# Patient Record
Sex: Male | Born: 1990 | Race: White | Hispanic: No | Marital: Single | Smoking: Never smoker
Health system: Southern US, Community
[De-identification: ages and names within clinical notes are randomized; demographics above are authoritative.]

---

## 2013-01-08 ENCOUNTER — Emergency Department: Payer: Self-pay | Admitting: Emergency Medicine

## 2013-06-10 ENCOUNTER — Ambulatory Visit: Payer: Self-pay | Admitting: Medical

## 2014-06-13 ENCOUNTER — Emergency Department: Payer: Self-pay | Admitting: Emergency Medicine

## 2014-06-22 ENCOUNTER — Ambulatory Visit: Payer: Self-pay | Admitting: Family Medicine

## 2014-06-24 ENCOUNTER — Encounter: Payer: Self-pay | Admitting: Sports Medicine

## 2014-06-24 ENCOUNTER — Ambulatory Visit (INDEPENDENT_AMBULATORY_CARE_PROVIDER_SITE_OTHER): Payer: BC Managed Care – PPO | Admitting: Sports Medicine

## 2014-06-24 VITALS — BP 117/73 | Ht 70.0 in | Wt 152.0 lb

## 2014-06-24 DIAGNOSIS — S62625A Displaced fracture of medial phalanx of left ring finger, initial encounter for closed fracture: Principal | ICD-10-CM

## 2014-06-24 DIAGNOSIS — IMO0002 Reserved for concepts with insufficient information to code with codable children: Secondary | ICD-10-CM | POA: Diagnosis not present

## 2014-06-24 DIAGNOSIS — IMO0001 Reserved for inherently not codable concepts without codable children: Secondary | ICD-10-CM | POA: Insufficient documentation

## 2014-06-24 NOTE — Progress Notes (Signed)
   Subjective:    Patient ID: Jose Bryant, male    DOB: 1991/01/22, 23 y.o.   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 He describes that his left hand was tangled in an opposing player's [place] when they fell to the ground, landing on the left hand. He noticed immediate pain located at the left ring finger with associated bruising and swelling. He went to [facility] that day where x-rays revealed a non-displaced 4th middle phalanx spiral fracture of the left hand. He has been wearing a finger splint since then. He notes that the finger remains somewhat painful and swollen. X-ray report was available for review, however, the images were not.  Past medical history, social history, medications, and allergies were reviewed and are up to date in the chart. Review of Systems 7 point review of systems was performed and was otherwise negative unless noted in the history of present illness.    Objective:   Physical Exam BP 117/73  Ht 5\' 10"  (1.778 m)  Wt 152 lb (68.947 kg)  BMI 21.81 kg/m2 GEN: The patient is well-developed well-nourished male and in no acute distress.  He is awake alert and oriented x3. SKIN: warm and well-perfused, no rash  Neuro: Strength 5/5 globally. Sensation intact throughout. No focal deficits. Vasc: +2 bilateral distal pulses. <2second capillary refill. MSK: Examination of the left hand reveals bruising and ecchymoses around the 4th finger. There appears to be less than 5 degrees of rotation of the distal finger. No step-off or bony deformity palpable. Though stiffened and painful, he has preserved strength at the PIP, DIP, and MCP with flexion and extension. No obvious tendinous injury. No joint laxity.    Assessment & Plan:  Please see problem based assessment and plan in the problem list.

## 2014-06-24 NOTE — Assessment & Plan Note (Signed)
-  Non-displaced spiral fx of 4th middle phalanx with less than 5 degrees of rotation of non-dominant hand -Keep finger in protective splint, can buddy tape to 3rd finger -Follow-up in 1-2 weeks for ultrasound of fracture to look for evidence of stable callus formation. -Repeat X-rays at next visit. -Monitor for any worsening signs of malrotation, increasing pain, swelling -If inadequate healing, would then consider Ortho referral

## 2014-06-25 ENCOUNTER — Ambulatory Visit: Payer: Self-pay | Admitting: Sports Medicine

## 2014-07-07 ENCOUNTER — Ambulatory Visit
Admission: RE | Admit: 2014-07-07 | Discharge: 2014-07-07 | Disposition: A | Discharge status: Home / Self Care | Payer: BC Managed Care – PPO | Source: Ambulatory Visit | Attending: Sports Medicine | Admitting: Sports Medicine

## 2014-07-07 ENCOUNTER — Encounter: Payer: Self-pay | Admitting: Sports Medicine

## 2014-07-07 ENCOUNTER — Ambulatory Visit (INDEPENDENT_AMBULATORY_CARE_PROVIDER_SITE_OTHER): Payer: BC Managed Care – PPO | Admitting: Sports Medicine

## 2014-07-07 VITALS — BP 106/72 | HR 79 | Ht 70.0 in | Wt 152.0 lb

## 2014-07-07 DIAGNOSIS — S62625D Displaced fracture of medial phalanx of left ring finger, subsequent encounter for fracture with routine healing: Secondary | ICD-10-CM | POA: Diagnosis not present

## 2014-07-07 DIAGNOSIS — IMO0001 Reserved for inherently not codable concepts without codable children: Secondary | ICD-10-CM

## 2014-07-07 NOTE — Assessment & Plan Note (Signed)
We'll obtain followup left fourth finger AP, lateral, and oblique x-rays today. -If everything appears to be in good position with signs of routine healing, he will followup in 2-3 weeks for reevaluation. -He will continue to wear his finger splint and remain out of soccer practice or competitions. -He may begin to do individual conditioning drills on his own. -We will call him with the x-ray results.

## 2014-07-07 NOTE — Progress Notes (Signed)
   Subjective:    Patient ID: Jose Bryant, male    DOB: Feb 12, 1991, 23 y.o.   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 Today he is approximately 2-1/2 weeks out. He has been wearing his finger splint diligently he says. He has not been practicing or competing in games. He denies any significant pain in the left hand or finger. He is not taking any medications at this time. He denies any numbness, tingling, loss of sensation, or increasing swelling.  Past medical history, social history, medications, and allergies were reviewed and are up to date in the chart.  Review of Systems 7 point review of systems was performed and was otherwise negative unless noted in the history of present illness.     Objective:   Physical Exam BP 106/72  Pulse 79  Ht 5\' 10"  (1.778 m)  Wt 152 lb (68.947 kg)  BMI 21.81 kg/m2 GEN: The patient is well-developed well-nourished male and in no acute distress.  He is awake alert and oriented x3. SKIN: warm and well-perfused, no rash  Neuro: Strength 5/5 globally. Sensation intact throughout. No focal deficits. Vasc: +2 bilateral distal pulses. No edema.  MSK: Examination of the left hand reveals full strength at the fourth finger joints. There is minimal swelling and bruising. There is no palpable step off at the fourth middle phalanx. There is no increase in rotation of the fourth finger on the left compared to the right.  Limited musculoskeletal ultrasound: Long and short axis views were obtained of the left 4th digit middle phalanx. The fracture line was identified and appears to be in good position. There is callus formation throughout. There are neovascular changes seen on Doppler imaging. There is no instability with dynamic testing.    Assessment & Plan:  Please see problem based assessment and plan in the problem list.

## 2014-07-08 ENCOUNTER — Telehealth: Payer: Self-pay | Admitting: Sports Medicine

## 2014-07-08 NOTE — Telephone Encounter (Signed)
Left VM. Discussed with Dr. Janee Mornhompson, [facility]. Patient should start taking finger out of the splint at least 3 times daily and doing some flexion ROM to avoid stiffness. Fx stable enough to start doing gentle exercises. Plan f/u as before. X-ray will show break for several months, but clinically will be healed.

## 2014-07-21 ENCOUNTER — Ambulatory Visit
Admission: RE | Admit: 2014-07-21 | Discharge: 2014-07-21 | Disposition: A | Discharge status: Home / Self Care | Payer: BC Managed Care – PPO | Source: Ambulatory Visit | Attending: Sports Medicine | Admitting: Sports Medicine

## 2014-07-21 ENCOUNTER — Encounter: Payer: Self-pay | Admitting: Sports Medicine

## 2014-07-21 ENCOUNTER — Ambulatory Visit (INDEPENDENT_AMBULATORY_CARE_PROVIDER_SITE_OTHER): Payer: BC Managed Care – PPO | Admitting: Sports Medicine

## 2014-07-21 VITALS — BP 110/60

## 2014-07-21 DIAGNOSIS — IMO0001 Reserved for inherently not codable concepts without codable children: Secondary | ICD-10-CM

## 2014-07-21 DIAGNOSIS — S62625D Displaced fracture of medial phalanx of left ring finger, subsequent encounter for fracture with routine healing: Principal | ICD-10-CM

## 2014-07-21 NOTE — Assessment & Plan Note (Signed)
-  Obtain updated x-rays of the 4th index finger now that he is 4 weeks out -Discontinue finger splint. -Start buddy taping, working on ROM exercises, gently push the DIP through ROM under warm water, but stop if significant pain -Remain out of soccer for at least the next 2 weeks (to complete out 6 wks post-fracture). -Plan follow-up in 4 weeks or sooner if needed with plan for repeat x-rays at that time (8wks post-injury)

## 2014-07-21 NOTE — Progress Notes (Signed)
   Subjective:    Patient ID: Jose Bryant, male    DOB: 1991-04-11, 23 y.o.   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 Today he is approximately 4-1/2 weeks out. He has been getting his finger out of his splint and doing range of motion exercises. He has not been practicing or competing in games. He denies any significant pain in the left hand or finger. He does note some stiffness at the DIP joint. He is not taking any medications at this time. He denies any numbness, tingling, loss of sensation, or increasing swelling.  Past medical history, social history, medications, and allergies were reviewed and are up to date in the chart.  Review of Systems  7 point review of systems was performed and was otherwise negative unless noted in the history of present illness.  Review of Systems     Objective:   Physical Exam BP 110/60 GEN: The patient is well-developed well-nourished male and in no acute distress. He is awake alert and oriented x3.  SKIN: warm and well-perfused, no rash  Neuro: Strength 5/5 globally. Sensation intact throughout. No focal deficits.  Vasc: +2 bilateral distal pulses. No edema.  MSK: Examination of the left hand reveals full strength at the fourth finger joints. There is minimal swelling and bruising. There is no palpable step off at the fourth middle phalanx. There is no increase in rotation of the fourth finger on the left compared to the right.  X-rays of the left fourth finger were obtained following his last visit showing normal bony alignment. The fracture line was visible.    Assessment & Plan:  Please see problem based assessment and plan in the problem list.

## 2014-08-25 ENCOUNTER — Encounter: Payer: Self-pay | Admitting: Sports Medicine

## 2014-08-25 ENCOUNTER — Ambulatory Visit
Admission: RE | Admit: 2014-08-25 | Discharge: 2014-08-25 | Disposition: A | Discharge status: Home / Self Care | Payer: BC Managed Care – PPO | Source: Ambulatory Visit | Attending: Sports Medicine | Admitting: Sports Medicine

## 2014-08-25 ENCOUNTER — Ambulatory Visit (INDEPENDENT_AMBULATORY_CARE_PROVIDER_SITE_OTHER): Payer: BC Managed Care – PPO | Admitting: Sports Medicine

## 2014-08-25 VITALS — BP 112/72 | Ht 70.0 in | Wt 150.0 lb

## 2014-08-25 DIAGNOSIS — S62625D Displaced fracture of medial phalanx of left ring finger, subsequent encounter for fracture with routine healing: Principal | ICD-10-CM

## 2014-08-25 DIAGNOSIS — IMO0001 Reserved for inherently not codable concepts without codable children: Secondary | ICD-10-CM

## 2014-08-25 NOTE — Progress Notes (Signed)
   Subjective:    Patient ID: Jose Bryant, male    DOB: 10-12-1990, 23 y.o.   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 Today he is approximately 9-1/2 weeks out. He has been buddy taping his finger if he is active and doing range of motion exercises. He notes less stiffness in the finger. He denies any significant pain in the left hand or finger.  He is not taking any medications at this time. He denies any numbness, tingling, loss of sensation, or increasing swelling.  7 point review of systems was performed and was otherwise negative unless noted in the history of present illness. Review of Systems     Objective:   Physical Exam BP 112/72 mmHg  Ht 5\' 10"  (1.778 m)  Wt 150 lb (68.04 kg)  BMI 21.52 kg/m2 GEN: The patient is well-developed well-nourished male and in no acute distress. He is awake alert and oriented x3.  SKIN: warm and well-perfused, no rash  Neuro: Strength 5/5 globally. Sensation intact throughout. No focal deficits.  Vasc: +2 bilateral distal pulses. No edema.  MSK: Examination of the left hand reveals full strength at the fourth finger joints. There is no swelling or bruising. There is no palpable step off at the fourth middle phalanx. There is less than 5 degrees of distal rotation of the fourth finger on the left compared to the right.  X-rays of the left fourth finger were obtained today showing normal bony alignment. The fracture line appears to be filling in significantly compared to prior films.    Assessment & Plan:  1. Closed Left 4th Middle Phalanx Fracture, healing well, 9.5 wks out  -Continue to protect if active by buddy taping over the next few weeks -We discussed that the slightest rotation of the distal phalanx is minor and should not affect his function, which he agrees with. I also explained that the only way to prevent this  would be to undergo a pinning procedure when he initially broke it, as we have previously discussed. He is very pleased with the results. -Resume return to full activities in graduated fashion -At this point he will follow-up as needed.

## 2016-07-15 IMAGING — CR DG FINGER RING 2+V*L*
4 series · 4 of 4 positions shown · non-contrast
Comparison: 07/07/2014

CLINICAL DATA: Subsequent encounter for ring finger fracture.

EXAM:
LEFT RING FINGER 2+V

[view not recorded (1 of 4)]
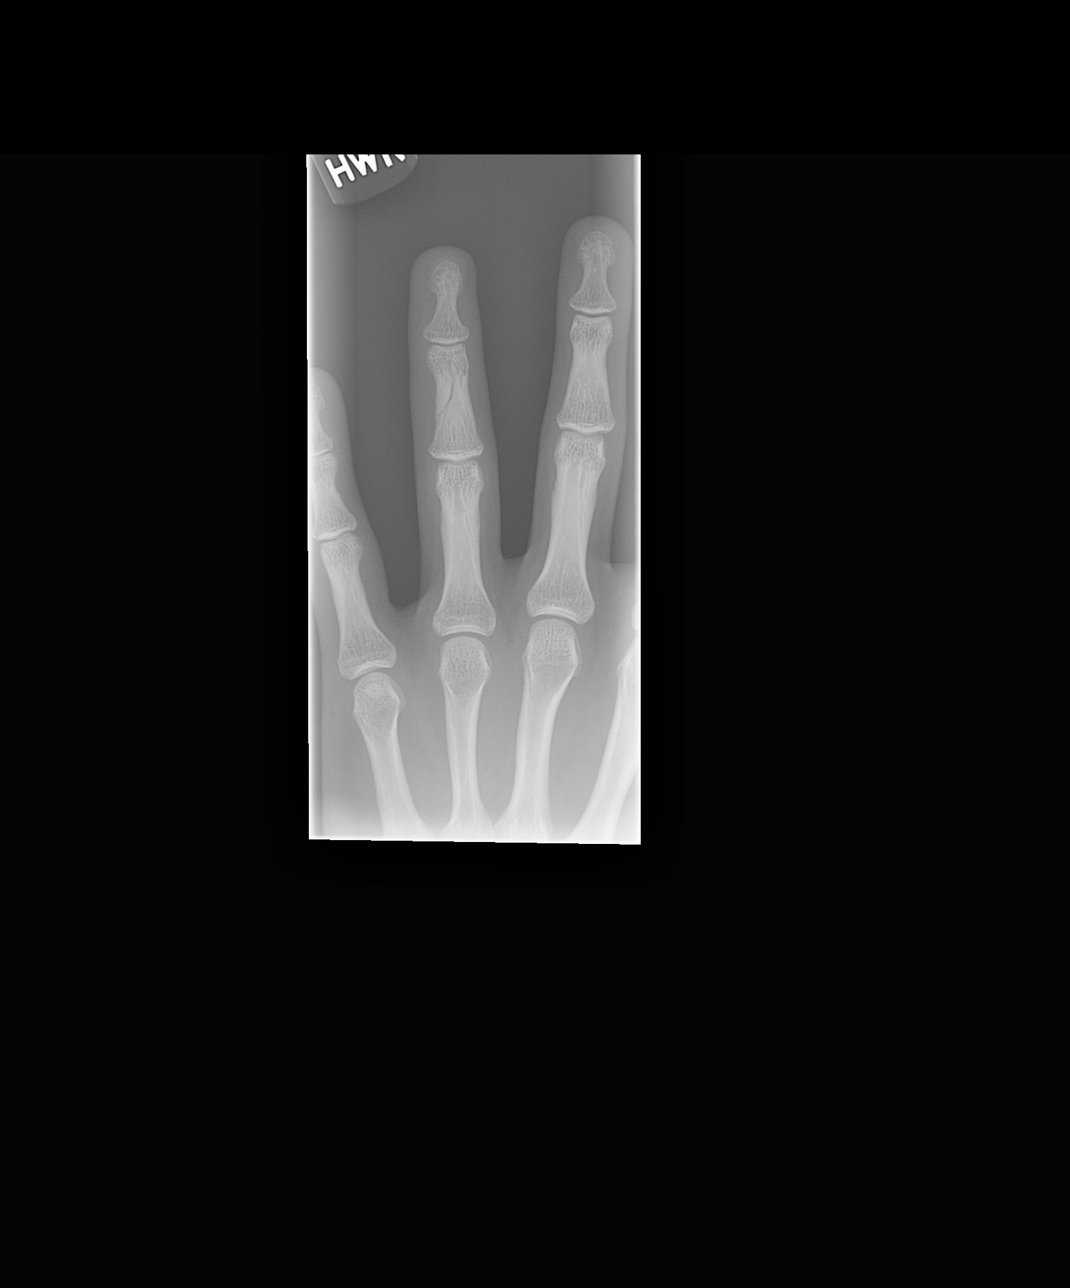

[view not recorded (2 of 4)]
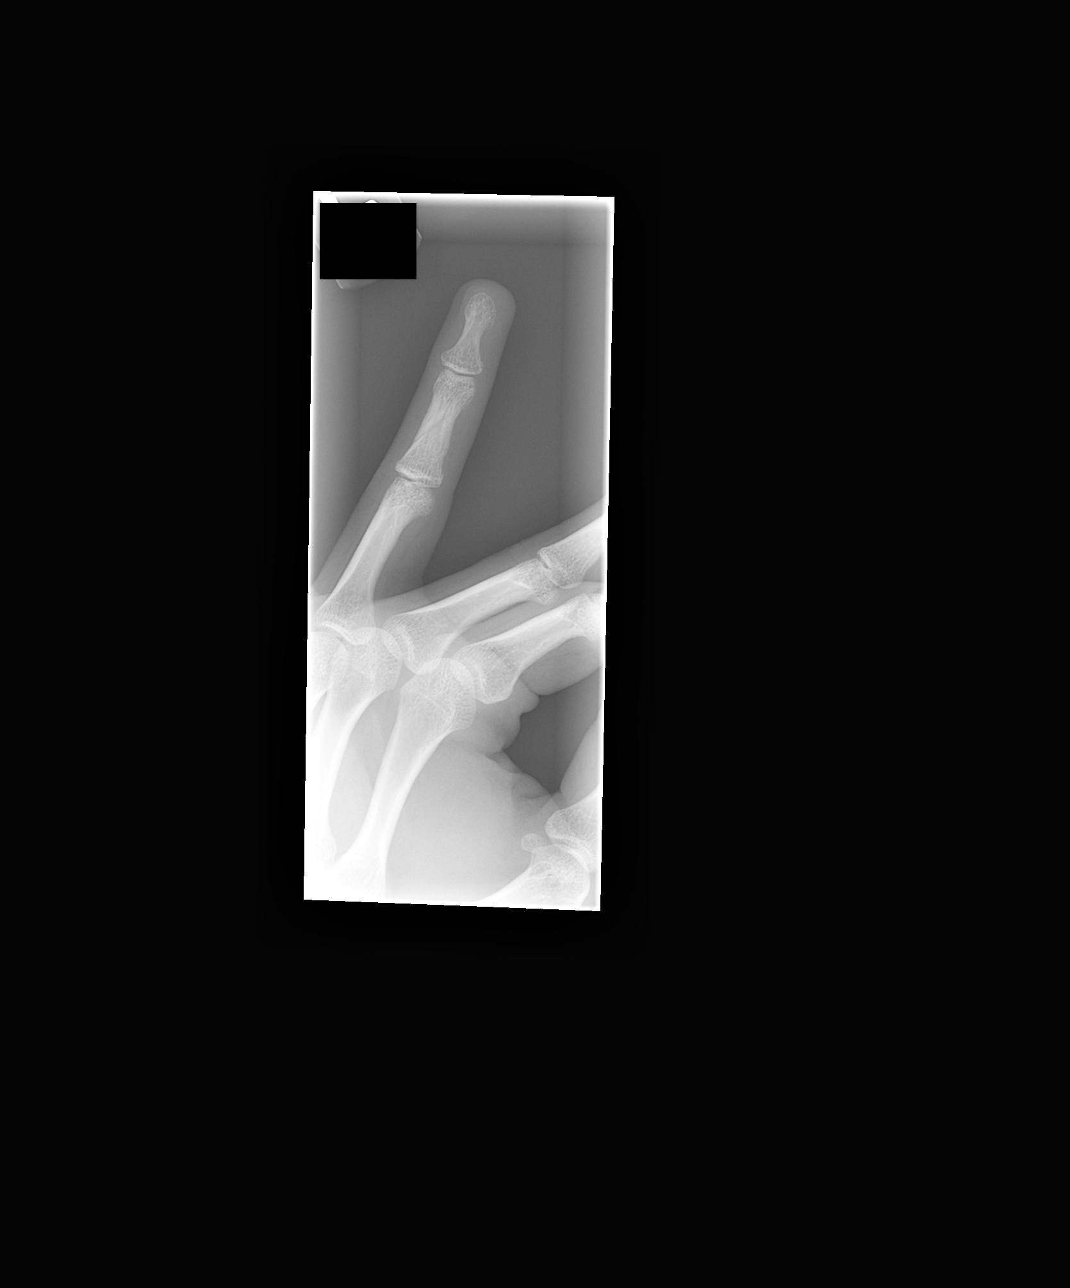

[view not recorded (3 of 4)]
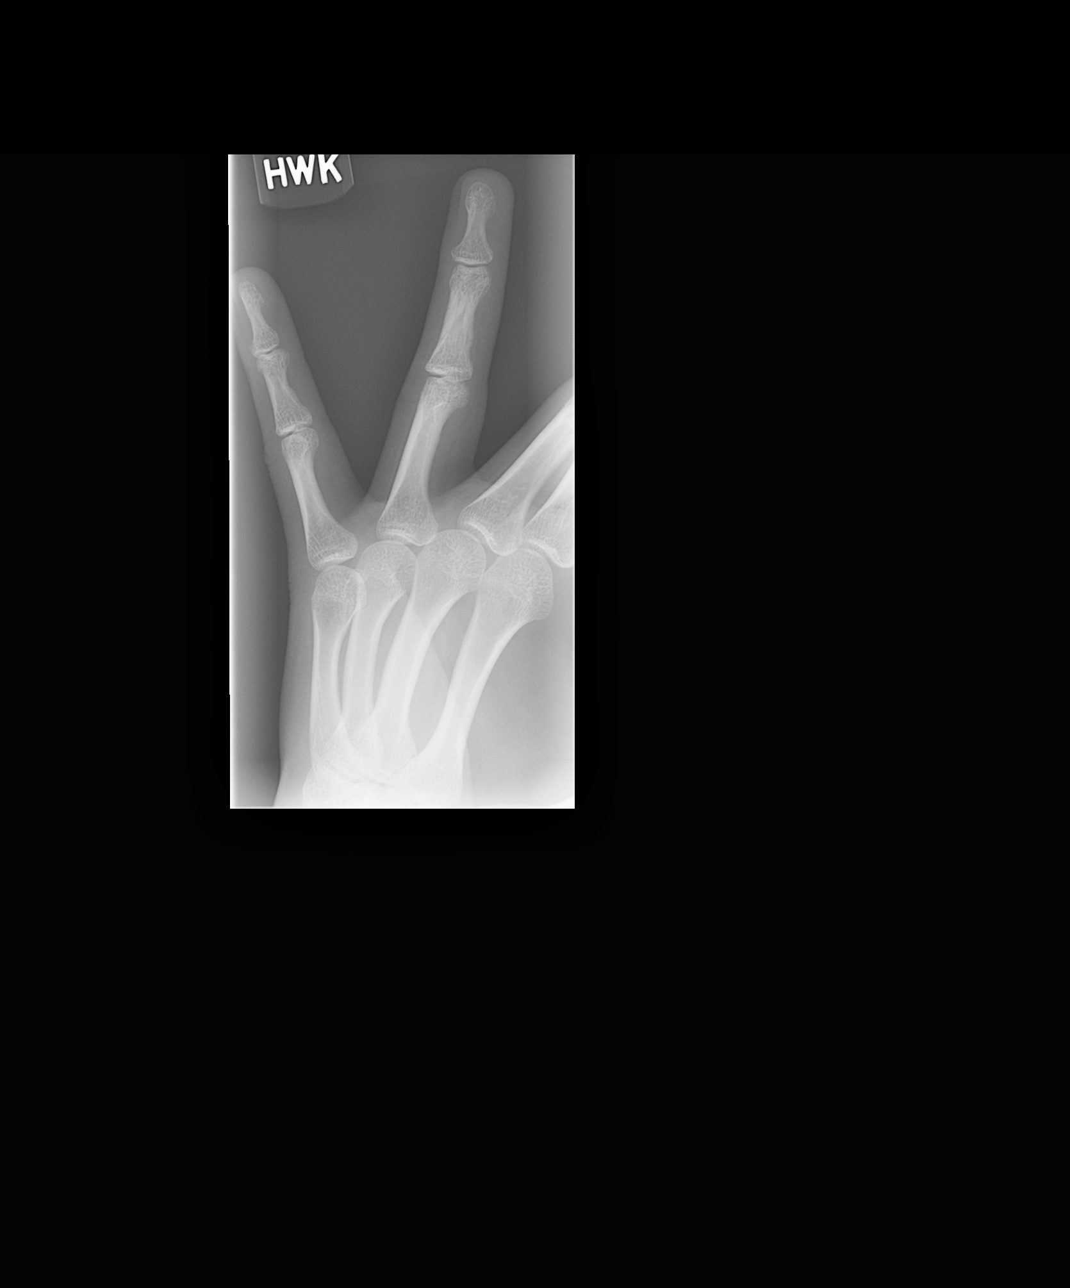

[view not recorded (4 of 4)]
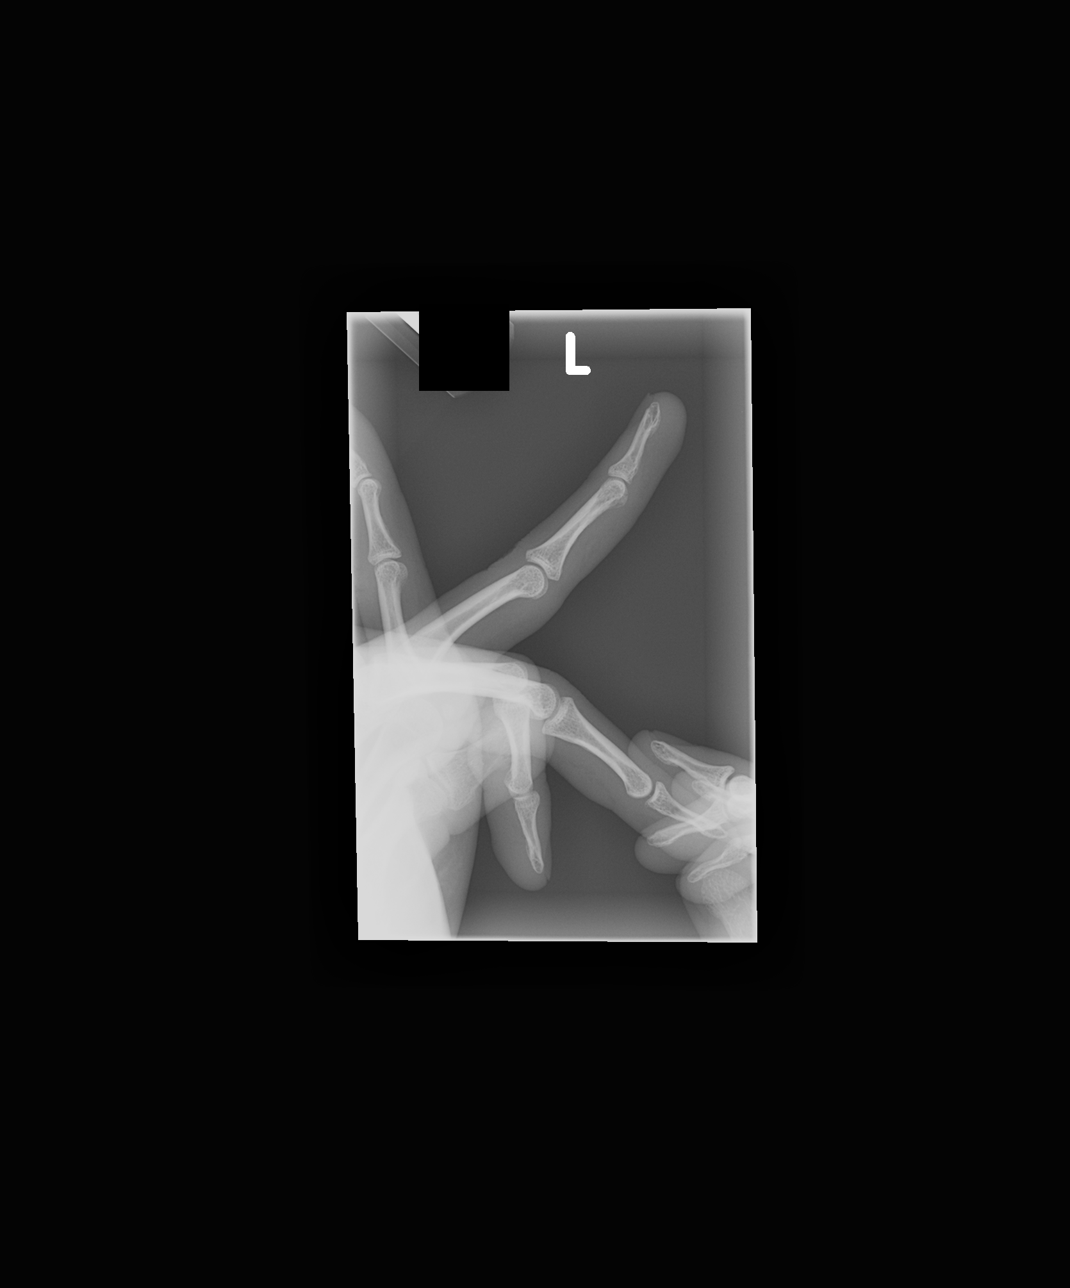

[4 of 4 positions shown; findings below may reference images not displayed]

FINDINGS: Stable nondisplaced fracture involving the middle phalanx of the
ring finger. No obvious callus formation.
IMPRESSION: Stable fracture of the middle phalanx.

## 2019-11-19 ENCOUNTER — Inpatient Hospital Stay: Admit: 2019-11-19 | Discharge: 2019-11-19 | Payer: PRIVATE HEALTH INSURANCE

## 2019-11-19 ENCOUNTER — Encounter: Admit: 2019-11-19 | Payer: PRIVATE HEALTH INSURANCE | Primary: Internal Medicine

## 2019-11-19 ENCOUNTER — Emergency Department: Admit: 2019-11-19 | Payer: PRIVATE HEALTH INSURANCE | Primary: Internal Medicine

## 2019-11-19 DIAGNOSIS — N50812 Left testicular pain: Secondary | ICD-10-CM

## 2019-11-19 MED ORDER — LEVOFLOXACIN 500 MG TABLET
500 mg | ORAL_TABLET | Freq: Every day | ORAL | 1 refills | Status: AC
Start: 2019-11-19 — End: ?

## 2019-11-20 NOTE — ED Notes
2255 Pt to Rm #11 ambulatory, changed into gown, awaiting exam by Clinician & U/S. Pt reports onset of left sided testicular pain, noted approx 5p after work today. Pt denies injury/trauma to area, denies urinary sx or penile discharge2358 Pt to U/S dept via stretcher2325 Pt ret'd from U/S dept via stretcher2340 Exam by Dr Sherron Flemings, awaiting further [id number] Pt cleared for & prepared for dischargeElectronically Signed by Raylene Everts, RN, November 19, 2019

## 2019-11-20 NOTE — ED Notes
Pt taken to [place] scan.

## 2019-11-20 NOTE — ED Provider Notes
HistoryChief Complaint Patient presents with ? Testicle Pain   Came in with left testicular pain since 5pm today, denies trauma.  Testicle PainThis is a new problem. Episode onset: Today after work  The problem occurs constantly. The problem has not changed since onset.Exacerbated by: Certain positions. He has tried nothing for the symptoms.  No past medical history on file.No past surgical history on file.Family History Problem Relation Age of Onset ? Hypertension Father  ? Cancer Maternal Grandfather       Esophageal Cancer  ? Heart disease Maternal Grandfather  Social History Socioeconomic History ? Marital status: Single   Spouse name: Not on file ? Number of children: Not on file ? Years of education: Not on file ? Highest education level: Not on file Tobacco Use ? Smoking status: Never Smoker ? Smokeless tobacco: Never Used Substance and Sexual Activity ? Alcohol use: Yes   Comment: socially  ? Drug use: Never ? Sexual activity: Yes   Partners: Female   Comment: Girlfriend  ED Other Social History E-cigarette/Vaping Substances E-cigarette/Vaping Devices Review of Systems Genitourinary: Positive for testicular pain. All other systems reviewed and are negative. Physical ExamED Triage Vitals [11/19/19 2252]BP: (!) 157/83Pulse: (!) 96Pulse from  O2 sat: n/aResp: 17Temp: 97.6 ?F (36.4 ?C)Temp src: TemporalSpO2: 97 % BP 130/77  - Pulse 73  - Temp 97.6 ?F (36.4 ?C) (Temporal)  - Resp 16  - Ht 5' 10 (1.778 m)  - Wt 68 kg  - SpO2 98%  - BMI 21.52 kg/m? Physical ExamHENT:    Head: Atraumatic.    Nose: Nose normal. Eyes:    Conjunctiva/sclera: Conjunctivae normal. Pulmonary:    Effort: No respiratory distress. Genitourinary:   Comments: Mildly tender L epididymis, testicles in normal position, no erythema or edema. Musculoskeletal:       General: No deformity. Skin:   Coloration: Skin is not jaundiced. Neurological:    Mental Status: He is alert. Mental status is at baseline. Psychiatric:       Mood and Affect: Mood normal.  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 He is monogamous with one partner and uses condoms. He has zero concern for STDs. His ultrasound was negative for testicular torsion. His epididymitis on exam was mildly tender but not the area where he was complaining of most pain. I prescribed him levofloxacin and told him that I doubt he has epididymitis but if his symptoms are worsening or he develops a fever he should start taking the medication or if symptoms stay at same level and not improving in 48 hours. Went over risks of tendon injury with levofloxacin, he does not engage in activities that would put him at risk for this. Cameron Proud MD MPHClinical Impressions as of Feb 25 0001 Pain in left testicle  ED DispositionDischarge Cameron Proud, MD02/25/21 0001

## 2019-11-20 NOTE — Discharge Instructions
If symptoms worsen or no improvement in 48 hours take the prescription

## 2020-01-01 ENCOUNTER — Telehealth: Admit: 2020-01-01 | Payer: PRIVATE HEALTH INSURANCE | Primary: Internal Medicine

## 2020-01-01 NOTE — Telephone Encounter
1st attempt called lvm to call back to schd an appointment for Pain in testicle schd in [place] with Dr Crisoforo Oxford referral

## 2020-01-26 MED ORDER — CIPROFLOXACIN 0.3 % EYE DROPS
0.3 | Freq: Four times a day (QID) | OPHTHALMIC | 1 refills | 10.00000 days | Status: AC
Start: 2020-01-26 — End: ?

## 2020-02-07 ENCOUNTER — Encounter: Admit: 2020-02-07 | Payer: PRIVATE HEALTH INSURANCE | Attending: Medical | Primary: Internal Medicine

## 2020-02-07 ENCOUNTER — Ambulatory Visit: Admit: 2020-02-07 | Payer: BLUE CROSS/BLUE SHIELD | Attending: Medical | Primary: Internal Medicine

## 2020-02-07 DIAGNOSIS — N50819 Testicular pain, unspecified: Secondary | ICD-10-CM

## 2020-02-07 NOTE — Progress Notes
HPIAdem Haynes is a 29 y.o. male referred to Urology by Dr. 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  Patient is a surgical physician assistant.  He works in [employer].  On 11/19/2019 he developed severe left testicular pain after standing all day and surgery. [facility].  The showed no focal masses or evidence of torsion or epididymitis.  The patient states that the pain improved without any specific treatment.  However he has continued to have intermittent low-grade left testicular pain that is centered on a focal area in the epididymis.  At times this area seems to be enlarged and firm and at other times totally normal.  He is mainly concerned about the possibility of a malignancy.  The pain itself is otherwise tolerable and he has not required any specific treatment for it.  He denies any flank pain or history of kidney stone.  He denies any hematuria or dysuria.  He reports a strong urinary stream and has no nocturia.  He is sexually active with 1 male partner for the last 8 years and uses condoms.  He is otherwise healthy and denies any nausea or vomiting, abdominal pain or bowel difficulties.  He had a recent corneal abrasion that has healed.  He has no known allergies to medications.  He takes no regular medications at this time.Study: US SCROTUM AND TESTICLES WITH LIMITED DOPPLER 11/19/2019?History: Left-sided scrotal pain since this evening.?Comparison: None available.?Technique: Wallace Cullens scale, color and pulsed Doppler imaging were utilized.?Findings:?The right testis measures 4.0 x 2.6 x 2.1 cm with normal echotexture, without focal masses. On pulsed Doppler, the intratesticular Ryan is 0.6. The right epididymal head measures 0.8 x 0.8 x 1.1 cm with an [place] of 0.5. ?The left testis measures 4.0 x 3.2 x 2.1 cm with normal echotexture, without focal masses. On pulsed Doppler, the intratesticular [place] is 0.6. The left epididymal head measures 0.8 x 0.9 x 0.9 cm with an [place] of 0.5. There is a 0.2 cm left epididymal head cyst.?Normal and symmetric flow is demonstrated in both testes on color Doppler. The epididymides demonstrate normal and symmetric flow bilaterally on color Doppler.?There is no varicocele or hydrocele.?Spermatic cords are normal.    Impression: ?Normal scrotal ultrasound; no evidence of torsion or epididymitis/orchitis.?Report Initiated By:  Mardene Speak, MD?Reported And Signed By: Lavera Guise, MDPast Medical HistoryNo past medical history on file.Past Surgical HistoryNo past surgical history on file.AllergiesNo Known AllergiesMedicationsNo outpatient encounter medications on file as of 02/07/2020. No facility-administered encounter medications on file as of 02/07/2020.   Social HistorySocial History Tobacco Use ? Smoking status: Never Smoker ? Smokeless tobacco: Never Used Substance Use Topics ? Alcohol use: Yes   Comment: socially  ? Drug use: Never  Family History Family History Problem Relation Age of Onset ? Hypertension Father  ? Cancer Maternal Grandfather       Esophageal Cancer  ? Heart disease Maternal Grandfather  Review of SystemsNo symptoms beyond those noted in HPI and PMH- all other systems are negativePhysical ExamBP 127/70  - Pulse 72  - Temp 98.2 ?F (36.8 ?C) (No-touch scanner)  - Ht 5' 10 (1.778 m)  - Wt 70.3 kg  - SpO2 97%  - BMI 22.24 kg/m? Constitutional: Oriented to person, place, and time. Appears well-developed and well-nourished. HENT: Trachea appears midlineHead: Normocephalic and atraumatic. Eyes: Conjunctivae are normal Neck: No tracheal deviation present. Pulmonary/Chest: Effort normal. Flank:  No CVA tenderness.Abdomen:  Flat, soft and nontender.  No guarding  or rebound.  Negative Murphy sign and no McBurney's point tenderness.Genital:  No inguinal adenopathy.  No genital rashes or sores.  Normal phallus.  Orthotopic meatus. Glans without lesions.  No urethral discharge.  Normal appearing scrotum.  Testes of normal size, consistency and lie and without masses, swelling or tenderness.  No palpable inguinal hernias.  Right cord structures are normal.  Left cord structures show a very mild left varicocele with Valsalva.  No palpable thrombosed varicocele.Musculoskeletal: Normal range of motion. Neurological: alert and oriented to person, place, and time. No focal deficits are evident. Skin: Skin is dry and without obvious lesionPsychiatric: Has a normal mood and affect. Behavior is normal. Judgment and thought content normal.Lab Results Component Value Date  UCOLOR yellow 02/07/2020  UGLUCOSE Negative 02/07/2020  UKETONE Negative 02/07/2020  USPECGRAVITY 1.010 02/07/2020  UPROTEIN Negative 02/07/2020  UNITRATES Negative 02/07/2020  UBLOOD Negative 02/07/2020  ULEUKOCYTES Negative 02/07/2020   No results found for: PVRPOCTAssessment & Plan:Manuel Haynes is a 29 y.o. male with intermittent left testicular pain for the past 3 months.  He has no history of kidney stone, flank pain or hematuria.  A scrotal ultrasound was negative for testicular mass or torsion.  On examination today no testicular mass is detected.  Testes are of normal size and are nontender.  No inguinal hernias were appreciated.  He has a small left varicocele.  Urinalysis today is negative.  I discussed the situation with the patient.  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  Management options for a varicocele reviewed.  The patient states that his main concern was ruling out possible malignancy and that the pain that he experiences is is not very bothersome at this point.  I reviewed conservative measures to help with pain including scrotal elevation, ice, Tylenol and anti-inflammatory medications.  The patient is already is already doing regular testicular self examination. He is encouraged to contact us should he have any questions, concerns or new symptoms going forward.  Strict for ER parameters were also reviewed.Encounter time: 30 minutes were spent reviewing the chart, taking a history, examining the patient, counseling the patient and making medical decisions, and coordinating follow up care.Caro Hight, PA

## 2020-07-18 ENCOUNTER — Inpatient Hospital Stay: Admit: 2020-07-18 | Discharge: 2020-07-18 | Payer: BLUE CROSS/BLUE SHIELD | Primary: Internal Medicine

## 2020-07-18 DIAGNOSIS — Z20828 Contact with and (suspected) exposure to other viral communicable diseases: Secondary | ICD-10-CM

## 2020-07-19 DIAGNOSIS — Z20822 Contact with and (suspected) exposure to covid-19: Secondary | ICD-10-CM

## 2020-07-19 LAB — SARS COV-2 (COVID-19) RNA-~~LOC~~ LABS (BH GH LMW YH): BKR SARS-COV-2 RNA (COVID-19) (YH): NOT DETECTED

## 2020-08-25 ENCOUNTER — Inpatient Hospital Stay: Admit: 2020-08-25 | Discharge: 2020-08-25 | Payer: BLUE CROSS/BLUE SHIELD | Primary: Internal Medicine

## 2020-08-25 DIAGNOSIS — Z20828 Contact with and (suspected) exposure to other viral communicable diseases: Secondary | ICD-10-CM

## 2020-08-26 DIAGNOSIS — Z20822 Contact with and (suspected) exposure to covid-19: Secondary | ICD-10-CM

## 2020-08-26 LAB — COVID-19 CLEARANCE OR FOR PLACEMENT ONLY: BKR SARS-COV-2 RNA (COVID-19) (YH): NOT DETECTED

## 2021-02-03 ENCOUNTER — Ambulatory Visit: Admit: 2021-02-03 | Payer: BLUE CROSS/BLUE SHIELD | Primary: Internal Medicine

## 2021-02-05 ENCOUNTER — Inpatient Hospital Stay: Admit: 2021-02-05 | Discharge: 2021-02-05 | Payer: BLUE CROSS/BLUE SHIELD | Primary: Internal Medicine

## 2021-02-05 DIAGNOSIS — Z20822 Contact with and (suspected) exposure to covid-19: Secondary | ICD-10-CM

## 2021-02-05 DIAGNOSIS — Z20828 Contact with and (suspected) exposure to other viral communicable diseases: Secondary | ICD-10-CM

## 2021-02-06 LAB — COVID-19 CLEARANCE OR FOR PLACEMENT ONLY: BKR SARS-COV-2 RNA (COVID-19) (YH): NOT DETECTED

## 2023-06-13 ENCOUNTER — Encounter
Admit: 2023-06-13 | Payer: PRIVATE HEALTH INSURANCE | Attending: Student in an Organized Health Care Education/Training Program | Primary: Internal Medicine

## 2023-06-13 DIAGNOSIS — Z1371 Encounter for nonprocreative screening for genetic disease carrier status: Secondary | ICD-10-CM

## 2023-06-13 NOTE — Progress Notes
 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 They are currently TTC, desires testing. Order placed.

## 2023-06-15 ENCOUNTER — Inpatient Hospital Stay: Admit: 2023-06-15 | Discharge: 2023-06-15 | Payer: PRIVATE HEALTH INSURANCE | Primary: Internal Medicine

## 2023-06-15 DIAGNOSIS — Z1371 Encounter for nonprocreative screening for genetic disease carrier status: Secondary | ICD-10-CM

## 2023-06-27 LAB — NATERA HORIZON 274 (PAN-ETHNIC EXTENDED) (BH GH YH)
3-BETA-HYDROXYSTEROID DEHYDROGENASE TYPE II DEFICIENCY: NEGATIVE
3-HYDROXY-3-METHYLGLUTARYL-COA LYASE DEFICIENCY: NEGATIVE
3-METHYLCROTONYL-COA CARBOXYLASE 1 DEFICIENCY: NEGATIVE
3-METHYLCROTONYL-COA CARBOXYLASE 2 DEFICIENCY: NEGATIVE
3-PHOSPHOGLYCERATE DEHYDROGENASE DEFICIENCY: NEGATIVE
6-PYRUVOYL-TETRAHYDROPTERIN SYNTHASE DEFICIENCY: NEGATIVE
ABETALIPOPROTEINEMIA: NEGATIVE
ACHONDROGENESIS, TYPE 1B (DIASTROPHIC DYSPLASIA): NEGATIVE
ACHROMATOPSIA: NEGATIVE
ACRODERMATITIS ENTEROPATHICA: NEGATIVE
ACUTE INFANTILE LIVER FAILURE: NEGATIVE
ACYL-COA OXIDASE I DEFICIENCY: NEGATIVE
AICARDI-GOUTIÃRES SYNDROME: NEGATIVE
ALPHA-MANNOSIDOSIS: NEGATIVE
ALPHA-THALASSEMIA: NEGATIVE
ALSTROM SYNDROME: NEGATIVE
AMT-RELATED GLYCINE ENCEPHALOPATHY: NEGATIVE
ANDERMANN SYNDROME: NEGATIVE
APPEARANCE: NEGATIVE mg/dL — AB (ref 20–275)
ARGININOSUCCINATE LYASE DEFICIENCY: NEGATIVE
AROMATASE DEFICIENCY: NEGATIVE
ASPARAGINE SYNTHETASE DEFICIENCY: NEGATIVE
ASPARTYLGLYCOSAMINURIA: NEGATIVE
ATAXIA WITH VITAMIN E DEFICIENCY: NEGATIVE
ATAXIA-TELANGIECTASIA: NEGATIVE
AUTISM SPECTRUM, EPILEPSY AND ARTHROGRYPOSIS: NEGATIVE
AUTOSOMAL RECESSIVE SPASTIC ATAXIA OF CHARLEVOIX-SAGUENAY: NEGATIVE
BACTERIA, UA: NEGATIVE /HPF — AB (ref 136–144)
BARDET-BIEDL SYNDROME, BBS1-RELATED: NEGATIVE
BARDET-BIEDL SYNDROME, BBS10-RELATED: NEGATIVE
BARDET-BIEDL SYNDROME, BBS12-RELATED: NEGATIVE
BARDET-BIEDL SYNDROME, BBS2-RELATED: NEGATIVE
BARE LYMPHOCYTE SYNDROME, TYPE II: NEGATIVE
BARTTER SYNDROME: NEGATIVE
BATTEN DISEASE (NEURONAL CEROID LIPOFUSCINOSIS, CLN3-RELATED): NEGATIVE
BETA-HEMOGLOBINOPATHIES: NEGATIVE
BILATERAL FRONTOPARIETAL POLYMICROGYRIA: NEGATIVE
BILIRUBIN: NEGATIVE
BIOTINIDASE DEFICIENCY: NEGATIVE
BLOOM SYNDROME: NEGATIVE
CANAVAN DISEASE: NEGATIVE
CARBAMOYL PHOSPHATE SYNTHETASE I DEFICIENCY: NEGATIVE
CARNITINE DEFICIENCY: NEGATIVE
CARNITINE PALMITOYLTRANSFERASE IA DEFICIENCY: NEGATIVE
CARNITINE PALMITOYLTRANSFERASE II DEFICIENCY: NEGATIVE
CARPENTER SYNDROME: NEGATIVE
CARTILAGE-HAIR HYPOPLASIA: NEGATIVE
CEREBROTENDINOUS XANTHOMATOSIS: NEGATIVE
CHARCOT-MARIE-TOOTH DISEASE, TYPE 4D: NEGATIVE
CHOREOACANTHOCYTOSIS: NEGATIVE
CHRONIC GRANULOMATOUS DISEASE, CYTOCHROME B-NEGATIVE: NEGATIVE
CILIOPATHIES, RPGRIP1L-RELATED: NEGATIVE
CITRIN DEFICIENCY: NEGATIVE
CITRULLINEMIA TYPE 1: NEGATIVE
CLN5-RELATED NEURONAL CEROID LIPOFUSCINOSIS: NEGATIVE
CLN6-RELATED NEURONAL CEROID LIPOFUSCINOSIS: NEGATIVE
COHEN SYNDROME: NEGATIVE
COL4A3-RELATED ALPORT SYNDROME: NEGATIVE
COL4A4-RELATED ALPORT SYNDROME: NEGATIVE
COMBINED MALONIC AND METHYLMALONIC ACIDURIA: NEGATIVE
COMBINED OXIDATIVE PHOSPHORYLATION DEFICIENCY (COMPLEX 4 DEFICIENCY): NEGATIVE
COMBINED OXIDATIVE PHOSPHORYLATION DEFICIENCY 3: NEGATIVE
COMBINED PITUITARY HORMONE DEFICIENCY-2: NEGATIVE
CONGENITAL ADRENAL HYPERPLASIA, 17-ALPHA-HYDROXYLASE DEFICIENCY: NEGATIVE
CONGENITAL AMEGAKARYOCYTIC THROMBOCYTOPENIA: NEGATIVE
CONGENITAL DISORDER OF GLYCOSYLATION TYPE IB: NEGATIVE
CONGENITAL DISORDER OF GLYCOSYLATION, TYPE 1A, PMM2-RELATED: NEGATIVE
CONGENITAL DISORDER OF GLYCOSYLATION, TYPE 1C: NEGATIVE
CONGENITAL FINNISH NEPHROSIS: NEGATIVE
CONGENITAL INSENSIVITY TO PAIN WITH ANHIDROSIS (CIPA): NEGATIVE
CONGENITAL MYASTHENIC SYNDROME, CHRNE-RELATED: NEGATIVE
CONGENITAL MYASTHENIC SYNDROME, RAPSN-RELATED: NEGATIVE
CONGENITAL NEUTROPENIA, HAX1-RELATED: NEGATIVE
CONGENITAL NEUTROPENIA, VPS45-RELATED: NEGATIVE
CORNEAL DYSTROPHY AND PERCEPTIVE DEAFNESS: NEGATIVE
CORTICOSTERONE METHYLOXIDASE DEFICIENCY: NEGATIVE
COSTEFF DISEASE OPTIC ATROPHY (3-METHYLGLUTACONIC ACIDURIA, TYPE III): NEGATIVE
CRB1-RELATED RETINAL DYSTROPHIES: NEGATIVE
CYSTIC FIBROSIS: NEGATIVE
CYSTINOSIS: NEGATIVE
D-BIFUNCTIONAL PROTEIN DEFICIENCY: NEGATIVE
DEAFNESS, AUTOSOMAL RECESSIVE 77: NEGATIVE
DYSKERATOSIS CONGENITA: NEGATIVE
DYSTROPHIC EPIDERMOLYSIS BULLOSA, AUTOSOMAL RECESSIVE: NEGATIVE
EHLERS-DANLOS SYNDROME, TYPE VIIC: NEGATIVE
ELLIS-VAN CREVELD SYNDOME (WEYERS ACROFACIAL DYSOSTOSIS): NEGATIVE
ENHANCED S-CONE SYNDROME (GOLDMANN-FAVRE SYNDROME): NEGATIVE
EPITHELIAL CELLS, SQUAMOUS: NEGATIVE /HPF (ref <=5)
ETHYLMALONIC ENCEPHALOPATHY: NEGATIVE
FACTOR XI DEFICIENCY: NEGATIVE
FAMILIAL DYSAUTONOMIA: NEGATIVE
FAMILIAL HYPERCHOLESTEROLEMIA, LDLR-RELATED: NEGATIVE
FAMILIAL HYPERCHOLESTEROLEMIA, LDLRAP1-RELATED: NEGATIVE
FAMILIAL HYPERINSULINISM: NEGATIVE
FAMILIAL MEDITERRANEAN FEVER: NEGATIVE
FAMILIAL NEUROPOPHYSEAL DIABETES INSIPIDUS, AUTOSOMAL RECESSIVE: NEGATIVE
FANCONI ANEMIA COMPLEMENTATION GROUP A: NEGATIVE
FANCONI ANEMIA, GROUP C: NEGATIVE
FANCONI ANEMIA, GROUP G: NEGATIVE
FUMARASE DEFICIENCY: NEGATIVE
GALACTOKINASE DEFICIENCY: NEGATIVE
GALACTOSEMIA: NEGATIVE
GAUCHER DISEASE: NEGATIVE
GITELMAN SYNDROME: NEGATIVE
GLUTARIC ACIDEMIA, TYPE 2A: NEGATIVE
GLUTARIC ACIDEMIA, TYPE 2C: NEGATIVE
GLUTARYL-COA DEHYDROGENASE DEFICIENCY (GLUTARIC ACIDEMIA, TYPE 1): NEGATIVE
GLYCINE ENCEPHALOPATHY: NEGATIVE
GLYCOGEN STORAGE DISEASE TYPE IA: NEGATIVE
GLYCOGEN STORAGE DISEASE TYPE IB: NEGATIVE
GLYCOGEN STORAGE DISEASE TYPE III: NEGATIVE
GLYCOGEN STORAGE DISEASE, TYPE 2 (POMPE DISEASE): NEGATIVE
GLYCOGEN STORAGE DISEASE, TYPE 4: NEGATIVE
GLYCOGEN STORAGE DISEASE, TYPE 5 (MCARDLE DISEASE): NEGATIVE
GLYCOGEN STORAGE DISEASE, TYPE VII: NEGATIVE
GRACILE SYNDROME: NEGATIVE
GUANIDINOACETATE METHYLTRANSFERASE DEFICIENCY: NEGATIVE
HEMOCHROMATOSIS, TYPE 2A: NEGATIVE
HEMOCHROMATOSIS, TYPE 3, TFR2-RELATED: NEGATIVE
HEREDITARY FRUCTOSE INTOLERANCE: NEGATIVE
HEREDITARY SPASTIC PARAPARESIS, TYPE 49: NEGATIVE
HERMANSKY-PUDLAK SYNDROME, HPS1-RELATED: NEGATIVE
HERMANSKY-PUDLAK SYNDROME, HPS3-RELATED: NEGATIVE
HOLOCARBOXYLASE SYNTHETASE DEFICIENCY: NEGATIVE
HOMOCYSTINURIA DUE TO DEFICIENCY OF MTHFR: NEGATIVE
HOMOCYSTINURIA, TYPE CBLE: NEGATIVE
HOMOCYSTINURIA: NEGATIVE
HYDROLETHALUS SYNDROME: NEGATIVE
HYPERINSULINEMIC HYPOGLYCEMIA: NEGATIVE
HYPERORNITHINEMIA-HYPERAMMONEMIA-HOMOCITRULLINURIA (HHH SYNDROME): NEGATIVE
HYPOPHOSPHATASIA, AUTOSOMAL RECESSIVE: NEGATIVE
INCLUSION BODY MYOPATHY 2: NEGATIVE
INFANTILE CEREBRAL AND CEREBELLAR ATROPHY: NEGATIVE
ISOVALERIC ACIDEMIA: NEGATIVE
JOUBERT SYNDROME 2: NEGATIVE
KETONES UA (QUEST): NEGATIVE
KETOTHIOLASE DEFICIENCY: NEGATIVE
KRABBE DISEASE: NEGATIVE
LAMELLAR ICHTHYOSIS, TYPE 1: NEGATIVE
LEBER CONGENITAL AMAUROSIS 2 (RETINITIS PIGMENTOSA 20): NEGATIVE
LEBER CONGENITAL AMAUROSIS, TYPE CEP290: NEGATIVE
LEBER CONGENITAL AMAUROSIS, TYPE LCA5: NEGATIVE
LEBER CONGENITAL AMAUROSIS, TYPE RDH12: NEGATIVE
LEIGH SYNDROME, FRENCH-CANADIAN TYPE: NEGATIVE
LETHAL CONGENITAL CONTRACTURE SYNDROME 1: NEGATIVE
LEUKOCYTE ESTERASE: NEGATIVE
LEUKOENCEPHALOPATHY WITH VANISHING WHITE MATTER: NEGATIVE
LIMB GIRDLE MUSCULAR DYSTROPHY, TYPE 2A: NEGATIVE
LIMB GIRDLE MUSCULAR DYSTROPHY, TYPE 2B: NEGATIVE
LIMB GIRDLE MUSCULAR DYSTROPHY, TYPE 2I: NEGATIVE
LIMB-GIRDLE MUSCULAR DYSTROPHY, TYPE 2C: NEGATIVE
LIMB-GIRDLE MUSCULAR DYSTROPHY, TYPE 2D: NEGATIVE
LIMB-GIRDLE MUSCULAR DYSTROPHY, TYPE 2E: NEGATIVE
LIPOAMIDE DEHYDROGENASE DEFICIENCY: NEGATIVE
LIPOID ADRENAL HYPERPLASIA: NEGATIVE
LIPOPROTEIN LIPASE DEFICIENCY: NEGATIVE
LONG CHAIN 3-HYDROXYACYL-COA DEHYDROGENASE DEFICIENCY: NEGATIVE
LYSINURIC PROTEIN INTOLERANCE: NEGATIVE
MAPLE SYRUP URINE DISEASE TYPE 1B: NEGATIVE
MAPLE SYRUP URINE DISEASE TYPE IA: NEGATIVE
MECKEL-GRUBER SYNDROME, TYPE 1: NEGATIVE
MEDIUM CHAIN ACYL-COA DEHYDROGENASE DEFICIENCY: NEGATIVE
MEGALENCEPHALIC LEUKOENCEPHALOPATHY WITH SUBCORTICAL CYSTS: NEGATIVE
METACHROMATIC LEUKODYSTROPHY, ARSA-RELATED: NEGATIVE
METACHROMATIC LEUKODYSTROPHY: NEGATIVE
METHYLMALONIC ACIDURIA AND HOMOCYSTINURIA, CBLC TYPE: NEGATIVE
METHYLMALONIC ACIDURIA AND HOMOCYSTINURIA, TYPE CBLD: NEGATIVE
METHYLMALONIC ACIDURIA, MMAA-RELATED: NEGATIVE
METHYLMALONIC ACIDURIA, MMAB-RELATED: NEGATIVE
METHYLMALONIC ACIDURIA, TYPE MUT(0): NEGATIVE
MICROPHTHALMIA/ANOPHTHALMIA: NEGATIVE
MITOCHONDRIAL COMPLEX 1 DEFICIENCY, NDUFAF5-RELATED: NEGATIVE
MITOCHONDRIAL COMPLEX 1 DEFICIENCY, NDUFS6-RELATED: NEGATIVE
MITOCHONDRIAL DNA DEPLETION SYNDROME 6 (HEPATOCEREBRAL TYPE): NEGATIVE
MITOCHONDRIAL MYOPATHY AND SIDEROBLASTIC ANEMIA (MLAS1): NEGATIVE
MUCOLIPIDOSIS II/IIIA: NEGATIVE
MUCOLIPIDOSIS III GAMMA: NEGATIVE
MUCOLIPIDOSIS TYPE IV: NEGATIVE
MUCOPOLYSACCHARIDISIS, TYPE IIIA (SANFILIPPO A): NEGATIVE
MUCOPOLYSACCHARIDOSIS TYPE I: NEGATIVE
MUCOPOLYSACCHARIDOSIS TYPE IIIB: NEGATIVE
MUCOPOLYSACCHARIDOSIS TYPE IIIC: NEGATIVE
MUCOPOLYSACCHARIDOSIS, TYPE IIID: NEGATIVE
MUCOPOLYSACCHARIDOSIS, TYPE IVB (GM1 GANGLIOSIDOSIS): NEGATIVE
MUCOPOLYSACCHARIDOSIS, TYPE IX: NEGATIVE
MUCOPOLYSACCHARIDOSIS, TYPE VI (MAROTEAUX-LAMY): NEGATIVE
MULTIPLE SULPHATASE DEFICIENCY: NEGATIVE
MUSCLE-EYE-BRAIN DISEASE, POMGNT1-RELATED: NEGATIVE
MYONEUROGASTROINTESTINAL ENCEPHALOPATHY (MNGIE): NEGATIVE
N-ACETYLGLUTAMATE SYNTHASE DEFICIENCY: NEGATIVE
NEMALINE MYOPATHY: NEGATIVE
NEURONAL CEROID LIPOFUSCINOSIS, MFSD8-RELATED: NEGATIVE
NEURONAL CEROID-LIPOFUSCINOSIS, CLN8-RELATED: NEGATIVE
NIEMANN PICK DISEASE, TYPE C1/D: NEGATIVE
NIEMANN-PICK DISEASE TYPE C2: NEGATIVE
NIEMANN-PICK DISEASE, TYPES A/B: NEGATIVE
NIJMEGEN BREAKAGE SYNDROME: NEGATIVE
NITRITE UA: NEGATIVE
NON-SYNDROMIC HEARING LOSS, GJB2-RELATED: NEGATIVE
OCCULT BLOOD: NEGATIVE
ODONTO-ONYCHO-DERMAL DYSPLASIA (SCHOPF-SCHULZ-PASSARGE SYNDROME): NEGATIVE
OMENN SYNDROME: NEGATIVE
ORNITHINE AMINOTRANSFERASE DEFICIENCY: NEGATIVE
OSTEOPETROSIS, INFANTILE MALIGNANT (OSTEOPETROSIS, AUTOSOMAL RECESSIVE): NEGATIVE
PENDRED SYNDROME: NEGATIVE
PH: NEGATIVE mg/mg creat — ABNORMAL HIGH (ref 5.0–8.0)
PHENYLKETONURIA: NEGATIVE
PITUITARY HORMONE DEFICIENCY, COMBINED 3: NEGATIVE
POLYCYSTIC KIDNEY DISEASE, AUTOSOMAL RECESSIVE: NEGATIVE
POLYGLANDULAR AUTOIMMUNE SYNDROME: NEGATIVE
PONTOCEREBELLAR HYPOPLASIA, RARS2-RELATED: NEGATIVE
PONTOCEREBELLAR HYPOPLASIA, TYPE 1A: NEGATIVE
PPT1-RELATED NEURONAL CEROID LIPOFUSCINOSIS: NEGATIVE
PRIMARY CILIARY DYSKINESIA, DNAH5-RELATED: NEGATIVE
PRIMARY CILIARY DYSKINESIA, DNAI1-RELATED: NEGATIVE
PRIMARY CILIARY DYSKINESIA, DNAI2-RELATED: NEGATIVE
PRIMARY HYPEROXALURIA TYPE 1: NEGATIVE
PRIMARY HYPEROXALURIA TYPE 2: NEGATIVE
PRIMARY HYPEROXALURIA TYPE 3: NEGATIVE
PROGRESSIVE CEREBELLO-CEREBRAL ATROPHY: NEGATIVE
PROGRESSIVE FAMILIAL INTRAHEPATIC CHOLESTASIS, TYPE 2: NEGATIVE
PROPIONIC ACIDEMIA, ALPHA SUBUNIT: NEGATIVE
PROPIONIC ACIDEMIA, BETA SUBUNIT: NEGATIVE
PROTEIN UA: NEGATIVE — AB
PROTEIN, TOTAL, RANDOM UR: NEGATIVE mg/dL (ref 5–24)
PYCNODYSOSTOSIS: NEGATIVE
PYRUVATE DEHYDROGENASE DEFICIENCY, AUTOSOMAL RECESSIVE: NEGATIVE
RBC, UA: NEGATIVE /HPF (ref <=2)
RENAL TUBULAR ACIDOSIS AND DEAFNESS, ATP6V1B1-RELATED: NEGATIVE
REPORT SUMMARY: NEGATIVE
RETINITIS PIGMENTOSA 25: NEGATIVE
RETINITIS PIGMENTOSA 26: NEGATIVE
RETINITIS PIGMENTOSA 28: NEGATIVE
RETINITIS PIGMENTOSA 59: NEGATIVE
RHIZOMELIC CHONDRODYSPLASIA PUNCTATA TYPE 1: NEGATIVE
RHIZOMELIC CHONDRODYSPLASIA PUNCTATA, TYPE 3: NEGATIVE
RIBOFLAVIN RESPONSIVE COMPLEX 1 DEFICIENCY: NEGATIVE
ROBERTS SYNDROME: NEGATIVE
SALLA DISEASE: NEGATIVE
SANDHOFF DISEASE: NEGATIVE
SCHIMKE IMMUNOOSSEOUS DYSPLASIA: NEGATIVE
SEGAWA SYNDROME, AUTOSOMAL RECESSIVE: NEGATIVE
SEVERE COMBINED IMMUNODEFICIENCY (ADENOSINE DEAMINASE DEFICIENCY): NEGATIVE
SEVERE COMBINED IMMUNODEFICIENCY, TYPE ATHABASKAN: NEGATIVE
SJOGREN-LARSSON SYNDROME: NEGATIVE
SMITH-LEMLI-OPITZ SYNDROME: NEGATIVE
SPECIFIC GRAVITY UA: NEGATIVE mg/g creat — ABNORMAL HIGH (ref 24–184)
SPINAL MUSCULAR ATROPHY: NEGATIVE
SPONDYLOTHORACIC DYSOSTOSIS: NEGATIVE
STEROID-RESISTANT NEPHROTIC SYNDROME: NEGATIVE
STUVE-WIEDEMANN SYNDROME: NEGATIVE
TAY-SACHS DISEASE (DNA ONLY): NEGATIVE
TPP1-RELATED NEURONAL CEROID LIPOFUSCINOSIS: NEGATIVE
TYROSINEMIA TYPE I: NEGATIVE
URINE COLOR (QUEST): NEGATIVE
USHER SYNDROME TYPE 3: NEGATIVE
USHER SYNDROME, TYPE 1B: NEGATIVE
USHER SYNDROME, TYPE 1C: NEGATIVE
USHER SYNDROME, TYPE 1D: NEGATIVE
USHER SYNDROME, TYPE 1F: NEGATIVE
USHER SYNDROME, TYPE 2A: NEGATIVE
VERY LONG CHAIN ACYL-COA DEHYDROGENASE DEFICIENCY: NEGATIVE
WALKER-WARBURG SYNDROME: NEGATIVE
WBC, UA: NEGATIVE /HPF (ref <=5)
WILSON DISEASE: NEGATIVE
WOLMAN DISEASE: NEGATIVE
ZELLWEGER SPECTRUM DISORDERS, PEX1-RELATED: NEGATIVE
ZELLWEGER SPECTRUM DISORDERS, PEX10-RELATED: NEGATIVE
ZELLWEGER SPECTRUM DISORDERS, PEX2-RELATED: NEGATIVE
ZELLWEGER SPECTRUM DISORDERS, PEX6-RELATED: NEGATIVE

## 2023-06-27 NOTE — Other
 Reviewed negative Horizon 274 panel. Will relay to wife.
